# Patient Record
Sex: Female | Born: 1990 | Race: White | Hispanic: No | Marital: Single | Smoking: Former smoker
Health system: Southern US, Community
[De-identification: ages and names within clinical notes are randomized; demographics above are authoritative.]

## PROBLEM LIST (undated history)

## (undated) DIAGNOSIS — F419 Anxiety disorder, unspecified: Secondary | ICD-10-CM

## (undated) HISTORY — PX: ANTERIOR CRUCIATE LIGAMENT REPAIR: SHX115

## (undated) NOTE — ED Provider Notes (Signed)
 Formatting of this note is different from the original. eMERGENCY dEPARTMENT eNCOUnter    CHIEF COMPLAINT   Chief Complaint  Patient presents with  ? Allergic Reaction    c/o hives all day without improvement with benadryl   HPI   Debra Avery is a 70 y.o. female who presents with hives all day. She took Benadryl at 3 and 2 Benadryl at 9 PM. She, states, that the hives pop up in different places, and are itchy.she denies any new exposures new foods, new detergents fabric softeners. She is staying at her sister's, but she uses the same detergents. She denies any fevers. She denies any difficulty swallowing, or difficulty breathing  PAST MEDICAL HISTORY   History reviewed. No pertinent past medical history.  SURGICAL HISTORY   Past Surgical History  Procedure Date  ? Knee arthroscopy w/ acl reconstruction and hamstring graft     HARDWARE REMOVAL DONE  2 WKS AGO   CURRENT MEDICATIONS   Celebrex Concerta monocycline, endocet, zoloft, phenergan   ALLERGIES   No Known Allergies  FAMILY HISTORY   History reviewed. No pertinent family history.  SOCIAL HISTORY   History   Social History  ? Marital Status: N/A    Spouse Name: N/A    Number of Children: N/A  ? Years of Education: N/A   Social History Main Topics  ? Smoking status: Never Smoker   ? Smokeless tobacco: None  ? Alcohol Use: No  ? Drug Use: No  ? Sexually Active:    Other Topics Concern  ? None   Social History Narrative  ? None   REVIEW OF SYSTEMS   Constitutional:  Denies fever, chills, weight loss or weakness.  Respiratory:  Denies cough or shortness of breath.  Cardiovascular:  Denies chest pain, palpitations or swelling.  GI:  Denies abdominal pain, nausea, vomiting, or diarrhea.   Skin:  Intact, warm and dry,  with rash Neurologic:  Denies headache, focal weakness or sensory changes.   Lymphatic:  Denies swollen glands.   See HPI for further details.  PHYSICAL EXAM   VITAL SIGNS ED Triage  Vitals  Temp 07/05/11 2351 98 F (36.7 C)  Temp Source 07/05/11 2351 Oral  Heart Rate 07/05/11 2351 109   Heart Rate Source --   Resp 07/05/11 2351 18   BP 07/05/11 2351 123/93 mmHg  BP Location --   BP Method 07/05/11 2351 Automatic  SpO2 07/05/11 2351 96 %  O2 Device 07/05/11 2351 None (Room air)  O2 Flow Rate (L/min) --   Pain Assessment 07/05/11 2351 0-10  Pain Score 07/05/11 2351 Zero   Constitutional:  Well developed, well nourished, no acute distress, non-toxic appearance.   HENT:  Normocephalic, atraumatic, bilateral external ears normal, oropharynx moist, no oral exudates, nose normal.  Neck:  Normal range of motion, no tenderness, supple, no stridor.  Respiratory:  Normal breath sounds, no respiratory distress, no wheezing, no chest tenderness.  Cardiovascular:  Normal heart rate, normal rhythm, no murmurs, no rubs, no gallops.  GI:  Bowel sounds normal, soft, no tenderness, no masses, no pulsatile masses.  Extremities:  Intact distal pulses, no edema, no tenderness, no cyanosis, no clubbing. Good range of motion in all major joints. No tenderness to palpation or major deformities noted.  Back: No midline or CVA tenderness.  Skin: Areas of erythema, which appear hive-like on her back, trunk, legs, and arms  FINAL IMPRESSION   Rash Urticaria  Montie LITTIE Fogo, MD 07/06/11 0120 Electronically signed by Montie  LITTIE Fogo, MD at 07/06/2011  1:20 AM EDT

---

## 2018-03-02 ENCOUNTER — Emergency Department
Admission: EM | Admit: 2018-03-02 | Discharge: 2018-03-02 | Disposition: A | Discharge status: Home / Self Care | Payer: Self-pay | Attending: Emergency Medicine | Admitting: Emergency Medicine

## 2018-03-02 ENCOUNTER — Emergency Department: Payer: Self-pay

## 2018-03-02 ENCOUNTER — Encounter: Payer: Self-pay | Admitting: Emergency Medicine

## 2018-03-02 DIAGNOSIS — R002 Palpitations: Secondary | ICD-10-CM | POA: Insufficient documentation

## 2018-03-02 DIAGNOSIS — R42 Dizziness and giddiness: Secondary | ICD-10-CM | POA: Insufficient documentation

## 2018-03-02 DIAGNOSIS — R0602 Shortness of breath: Secondary | ICD-10-CM | POA: Insufficient documentation

## 2018-03-02 DIAGNOSIS — Z87891 Personal history of nicotine dependence: Secondary | ICD-10-CM | POA: Insufficient documentation

## 2018-03-02 DIAGNOSIS — F419 Anxiety disorder, unspecified: Secondary | ICD-10-CM | POA: Insufficient documentation

## 2018-03-02 LAB — FIBRIN DERIVATIVES D-DIMER (ARMC ONLY): FIBRIN DERIVATIVES D-DIMER (ARMC): 176.14 ng{FEU}/mL (ref 0.00–499.00)

## 2018-03-02 LAB — BASIC METABOLIC PANEL
ANION GAP: 11 (ref 5–15)
BUN: 15 mg/dL (ref 6–20)
CALCIUM: 9.6 mg/dL (ref 8.9–10.3)
CO2: 24 mmol/L (ref 22–32)
Chloride: 103 mmol/L (ref 101–111)
Creatinine, Ser: 0.74 mg/dL (ref 0.44–1.00)
Glucose, Bld: 105 mg/dL — ABNORMAL HIGH (ref 65–99)
POTASSIUM: 3.5 mmol/L (ref 3.5–5.1)
Sodium: 138 mmol/L (ref 135–145)

## 2018-03-02 LAB — CBC
HEMATOCRIT: 40.9 % (ref 35.0–47.0)
Hemoglobin: 14.3 g/dL (ref 12.0–16.0)
MCH: 30.3 pg (ref 26.0–34.0)
MCHC: 35.1 g/dL (ref 32.0–36.0)
MCV: 86.5 fL (ref 80.0–100.0)
Platelets: 353 10*3/uL (ref 150–440)
RBC: 4.72 MIL/uL (ref 3.80–5.20)
RDW: 13.8 % (ref 11.5–14.5)
WBC: 11.4 10*3/uL — ABNORMAL HIGH (ref 3.6–11.0)

## 2018-03-02 LAB — POCT PREGNANCY, URINE: PREG TEST UR: NEGATIVE

## 2018-03-02 LAB — TROPONIN I

## 2018-03-02 NOTE — ED Triage Notes (Signed)
Patient presents to ED via EMS from home with c/o shortness of breath. Patient was recently on a long plane ride from [place]. Patient reports while she was on the plane she had concerns that she was going to die. Patient states "I have been having increased anxiety".
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
## 2018-03-02 NOTE — ED Notes (Signed)
Patient transported to X-ray 

## 2018-03-06 DIAGNOSIS — F419 Anxiety disorder, unspecified: Secondary | ICD-10-CM

## 2018-03-06 HISTORY — DX: Anxiety disorder, unspecified: F41.9

## 2018-06-22 ENCOUNTER — Other Ambulatory Visit: Payer: Self-pay

## 2018-06-22 ENCOUNTER — Emergency Department: Payer: Self-pay

## 2018-06-22 ENCOUNTER — Emergency Department
Admission: EM | Admit: 2018-06-22 | Discharge: 2018-06-22 | Disposition: A | Discharge status: Home / Self Care | Payer: Self-pay | Attending: Emergency Medicine | Admitting: Emergency Medicine

## 2018-06-22 DIAGNOSIS — N939 Abnormal uterine and vaginal bleeding, unspecified: Secondary | ICD-10-CM

## 2018-06-22 DIAGNOSIS — Z87891 Personal history of nicotine dependence: Secondary | ICD-10-CM | POA: Insufficient documentation

## 2018-06-22 DIAGNOSIS — N920 Excessive and frequent menstruation with regular cycle: Secondary | ICD-10-CM | POA: Insufficient documentation

## 2018-06-22 HISTORY — DX: Anxiety disorder, unspecified: F41.9

## 2018-06-22 LAB — URINALYSIS, COMPLETE (UACMP) WITH MICROSCOPIC
Bacteria, UA: NONE SEEN
Bilirubin Urine: NEGATIVE
Glucose, UA: NEGATIVE mg/dL
Ketones, ur: NEGATIVE mg/dL
Leukocytes, UA: NEGATIVE
Nitrite: NEGATIVE
PH: 5 (ref 5.0–8.0)
Protein, ur: 30 mg/dL — AB
RBC / HPF: >50 RBC/hpf — ABNORMAL HIGH (ref 0–5)
SPECIFIC GRAVITY, URINE: 1.026 (ref 1.005–1.030)

## 2018-06-22 LAB — BASIC METABOLIC PANEL
Anion gap: 5 (ref 5–15)
BUN: 14 mg/dL (ref 6–20)
CALCIUM: 9.1 mg/dL (ref 8.9–10.3)
CO2: 26 mmol/L (ref 22–32)
CREATININE: 0.71 mg/dL (ref 0.44–1.00)
Chloride: 107 mmol/L (ref 98–111)
GFR calc Af Amer: >60 mL/min (ref >60)
GFR calc non Af Amer: >60 mL/min (ref >60)
GLUCOSE: 106 mg/dL — AB (ref 70–99)
Potassium: 3.8 mmol/L (ref 3.5–5.1)
Sodium: 138 mmol/L (ref 135–145)

## 2018-06-22 LAB — CBC
HCT: 33.6 % — ABNORMAL LOW (ref 35.0–47.0)
Hemoglobin: 11.8 g/dL — ABNORMAL LOW (ref 12.0–16.0)
MCH: 30.8 pg (ref 26.0–34.0)
MCHC: 35.3 g/dL (ref 32.0–36.0)
MCV: 87.3 fL (ref 80.0–100.0)
Platelets: 408 10*3/uL (ref 150–440)
RBC: 3.85 MIL/uL (ref 3.80–5.20)
RDW: 14.3 % (ref 11.5–14.5)
WBC: 12.2 10*3/uL — ABNORMAL HIGH (ref 3.6–11.0)

## 2018-06-22 LAB — TYPE AND SCREEN
ABO/RH(D): O POS
Antibody Screen: NEGATIVE

## 2018-06-22 LAB — TSH: TSH: 2.44 u[IU]/mL (ref 0.350–4.500)

## 2018-06-22 LAB — POCT PREGNANCY, URINE: PREG TEST UR: NEGATIVE

## 2018-06-22 LAB — HCG, QUANTITATIVE, PREGNANCY: hCG, Beta Chain, Quant, S: <1 m[IU]/mL (ref <5)

## 2018-06-22 MED ORDER — ONDANSETRON 4 MG PO TBDP: 4.0000 mg | ORAL_TABLET | Freq: Four times a day (QID) | ORAL | 0 refills | Status: DC | PRN

## 2018-06-22 MED ORDER — NORETHINDRONE-ETH ESTRADIOL 1-35 MG-MCG PO TABS: ORAL_TABLET | ORAL | 0 refills | Status: DC

## 2018-06-22 MED ORDER — NORETHINDRONE-ETH ESTRADIOL 1-35 MG-MCG PO TABS: 1.0000 | ORAL_TABLET | Freq: Every day | ORAL | 0 refills | Status: DC

## 2018-06-22 MED ORDER — NORETHINDRONE ACETATE 5 MG PO TABS
5.0000 mg | ORAL_TABLET | Freq: Once | ORAL | Status: AC
  Administered 2018-06-22: 5 mg via ORAL
  Filled 2018-06-22: qty 1

## 2018-06-22 MED ORDER — IBUPROFEN 600 MG PO TABS
600.0000 mg | ORAL_TABLET | ORAL | Status: AC
  Administered 2018-06-22: 600 mg via ORAL
  Filled 2018-06-22: qty 1

## 2018-06-22 MED ORDER — ONDANSETRON 4 MG PO TBDP: 4.0000 mg | ORAL_TABLET | Freq: Four times a day (QID) | ORAL | 0 refills | Status: AC | PRN

## 2018-06-22 MED ORDER — NORETHINDRONE-ETH ESTRADIOL 1-35 MG-MCG PO TABS: ORAL_TABLET | ORAL | 0 refills | Status: AC

## 2018-06-22 NOTE — Discharge Instructions (Signed)
Please follow up closely with obstetrics and gynecology or your primary doctor.  Return to the emergency room if your bleeding worsens, you become weak and dizzy or lightheaded, you have an episode of passing out, develop severe bleeding such as more than 1 soaked pad per hour for more than 3 straight hours, develop abdominal or pelvic pain, fevers chills or other new concerns arise.   

## 2018-06-22 NOTE — ED Provider Notes (Signed)
[facility] Emergency Department Provider Note   ____________________________________________   First MD Initiated Contact with Patient 06/22/18 1652     (approximate)  I have reviewed the triage vital signs and the nursing notes.   HISTORY  Chief Complaint Vaginal Bleeding    HPI Debra Avery is a 27 y.o. female's been having heavier than expected vaginal bleeding since about Wednesday.  Has fairly regular menstrual cycles, expected her menses to start last week and began Wednesday.  However on Friday she was going through about a tampon every 20 minutes for a few hours.  Additionally, for the last couple of days she has had some slight crampiness that seems to be improving, and her bleeding has been lessening over the last day but continues to bleed with fairly large clots at times.  She reports she is suspicious she could be developing polycystic ovaries based on discussion with friends and others.  Denies history of cysts.  Not in any pain at present, it was somewhat painful not felt similar to normal menstrual cycle last couple of days.  Bleeding is slowly decreasing.  Takes no medications except an antidepressant is a non-smoker without history of blood clots  Denies pregnancy  No chest pain or trouble breathing.  Past Medical History:  Diagnosis Date  . Anxiety 03/2018    There are no active problems to display for this patient.   Past Surgical History:  Procedure Laterality Date  . ANTERIOR CRUCIATE LIGAMENT REPAIR      Prior to Admission medications   Medication Sig Start Date End Date Taking? Authorizing Provider  norethindrone-ethinyl estradiol 1/35 (ORTHO-NOVUM 1/35, 28,) tablet Take 1 tablet 4 times daily for 4 days (until bleeding stops), then Take 1 tablet 3 times daily for 3 days, then Take 1 tablet 2 times daily for 2 days, then Take 1 tablet daily for 3 weeks 06/22/18   Sharyn Creamer, MD  ondansetron (ZOFRAN ODT) 4 MG  disintegrating tablet Take 1 tablet (4 mg total) by mouth every 6 (six) hours as needed for nausea or vomiting. 06/22/18   Sharyn Creamer, MD    Allergies Patient has no known allergies.  History reviewed. No pertinent family history.  Social History Social History   Tobacco Use  . Smoking status: Former Smoker    Last attempt to quit: 10/02/2017    Years since quitting: 0.7  . Smokeless tobacco: Never Used  Substance Use Topics  . Alcohol use: Yes    Comment: Social  . Drug use: Yes    Types: Marijuana    Review of Systems Constitutional: No fever/chills but some lightheadedness last 2 days ENT: No sore throat. Cardiovascular: Denies chest pain. Respiratory: Denies shortness of breath. Gastrointestinal: No abdominal pain.  No nausea, no vomiting.  No diarrhea.  No constipation. Genitourinary: Negative for dysuria. GYN: See HPI Musculoskeletal: Negative for back pain. Skin: Negative for rash. Neurological: Negative for headaches or focal weakness.    ____________________________________________   PHYSICAL EXAM:  VITAL SIGNS: ED Triage Vitals  Enc Vitals Group     BP 06/22/18 1517 131/73     Pulse Rate 06/22/18 1517 95     Resp --      Temp 06/22/18 1517 98.4 F (36.9 C)     Temp Source 06/22/18 1517 Oral     SpO2 06/22/18 1517 99 %     Weight 06/22/18 1518 230 lb (104.3 kg)     Height 06/22/18 1518 5\' 2"  (1.575 m)  Head Circumference --      Peak Flow --      Pain Score 06/22/18 1517 0     Pain Loc --      Pain Edu? --      Excl. in GC? --     Constitutional: Alert and oriented. Well appearing and in no acute distress. Eyes: Conjunctivae are normal. Head: Atraumatic. Nose: No congestion/rhinnorhea. Mouth/Throat: Mucous membranes are moist. Neck: No stridor.   Cardiovascular: Normal rate, regular rhythm. Grossly normal heart sounds.  Good peripheral circulation. Respiratory: Normal respiratory effort.  No retractions. Lungs CTAB. Gastrointestinal:  Soft and nontender. No distention. Musculoskeletal: No lower extremity tenderness nor edema. Neurologic:  Normal speech and language. No gross focal neurologic deficits are appreciated.  Skin:  Skin is warm, dry and intact. No rash noted. Psychiatric: Mood and affect are normal. Speech and behavior are normal.  ____________________________________________   LABS (all labs ordered are listed, but only abnormal results are displayed)  Labs Reviewed  BASIC METABOLIC PANEL - Abnormal; Notable for the following components:      Result Value   Glucose, Bld 106 (*)    All other components within normal limits  CBC - Abnormal; Notable for the following components:   WBC 12.2 (*)    Hemoglobin 11.8 (*)    HCT 33.6 (*)    All other components within normal limits  URINALYSIS, COMPLETE (UACMP) WITH MICROSCOPIC - Abnormal; Notable for the following components:   Color, Urine YELLOW (*)    APPearance CLOUDY (*)    Hgb urine dipstick LARGE (*)    Protein, ur 30 (*)    RBC / HPF >50 (*)    All other components within normal limits  HCG, QUANTITATIVE, PREGNANCY  TSH  POC URINE PREG, ED  POCT PREGNANCY, URINE  CBG MONITORING, ED  TYPE AND SCREEN   ____________________________________________  EKG   ____________________________________________  RADIOLOGY    Ultrasound no acute findings reviewed by me. ____________________________________________   PROCEDURES  Procedure(s) performed: None  Procedures  Critical Care performed: No  ____________________________________________   INITIAL IMPRESSION / ASSESSMENT AND PLAN / ED COURSE  Pertinent labs & imaging results that were available during my care of the patient were reviewed by me and considered in my medical decision making (see chart for details).  Patient presents for vaginal bleeding.  Appears consistent in timing with her normal cycle, but heavier than normal.  Differential diagnosis includes menorrhagia, polycystic  ovarian syndrome, hypothyroidism, hormonal change, ectopic.  No signs or symptoms of infectious etiology.  Hemoglobin reduced but mildly so, few days of bleeding in her she reports her breathing is improved.  Normal vital signs.  Nontoxic well-appearing.  Will obtain ultrasound to further evaluate.  Pregnancy test negative  ----------------------------------------- 7:30 PM on 06/22/2018 -----------------------------------------  Case and care discussed with Dr. Feliberto GottronSchermerhorn who advises tapered birth control, wrote prescription as advised by Dr. Feliberto GottronSchermerhorn and patient will follow-up as appointment with OB[facility].  Return precautions and treatment recommendations and follow-up discussed with the patient who is agreeable with the plan.  Carefully expressed discussed return precautions especially around ongoing or worsening bleeding with both the patient and her mother who are in agreement.       ____________________________________________   FINAL CLINICAL IMPRESSION(S) / ED DIAGNOSES  Final diagnoses:  Episode of heavy vaginal bleeding  Menorrhagia with regular cycle      NEW MEDICATIONS STARTED DURING THIS VISIT:  Current Discharge Medication List  START taking these medications   Details  norethindrone-ethinyl estradiol 1/35 (ORTHO-NOVUM 1/35, 28,) tablet Take 1 tablet 4 times daily for 4 days (until bleeding stops), then Take 1 tablet 3 times daily for 3 days, then Take 1 tablet 2 times daily for 2 days, then Take 1 tablet daily for 3 weeks Qty: 1 Package, Refills: 0    ondansetron (ZOFRAN ODT) 4 MG disintegrating tablet Take 1 tablet (4 mg total) by mouth every 6 (six) hours as needed for nausea or vomiting. Qty: 20 tablet, Refills: 0         Note:  This document was prepared using Dragon voice recognition software and may include unintentional dictation errors.     Sharyn Creamer, MD 06/22/18 [phone]

## 2018-06-22 NOTE — ED Notes (Signed)
Pt to and from US. ABCs intact. NAD.

## 2018-06-22 NOTE — ED Triage Notes (Signed)
Pt arrives via POV with complaints of vaginal bleeding. Pt ambulatory to triage. Pt states  She started bleeding sept 11th, started passing clots the 13. Pt states she placed tampon and soaked through in 20 min, then changed tampon 3 times after that she estimates some clots to be 3-4" in size. Continues to pass clots and had to start wearing adult briefs today. Pt states  "it feels like scrapped my insides out". Pt complains of weakness, lightheaded, headache, "seeing stars a little bit". Norm periods have been mild, lasting 4-5 days.

## 2018-06-22 NOTE — ED Notes (Signed)
Quale MD at bedside 

## 2019-09-07 ENCOUNTER — Other Ambulatory Visit: Payer: Self-pay

## 2019-09-07 DIAGNOSIS — Z20822 Contact with and (suspected) exposure to covid-19: Secondary | ICD-10-CM

## 2019-09-09 ENCOUNTER — Telehealth: Payer: Self-pay

## 2019-09-09 NOTE — Telephone Encounter (Signed)
Patient advise that result are not back in yet

## 2019-09-10 ENCOUNTER — Telehealth: Payer: Self-pay

## 2019-09-10 LAB — NOVEL CORONAVIRUS, NAA: SARS-CoV-2, NAA: NOT DETECTED

## 2019-09-10 NOTE — Telephone Encounter (Signed)
Pt called for covid results. Advised pt that results are not back.

## 2020-06-03 IMAGING — US US PELVIS COMPLETE
1 series · 13 of 25 positions shown · non-contrast
Comparison: None.

CLINICAL DATA: Pelvic pain and vaginal bleeding.

EXAM:
TRANSABDOMINAL AND TRANSVAGINAL ULTRASOUND OF PELVIS
DOPPLER ULTRASOUND OF OVARIES
TECHNIQUE: Both transabdominal and transvaginal ultrasound examinations of the
pelvis were performed. Transabdominal technique was performed for
global imaging of the pelvis including uterus, ovaries, adnexal
regions, and pelvic cul-de-sac.
It was necessary to proceed with endovaginal exam following the
transabdominal exam to visualize the adnexa. Color and duplex
Doppler ultrasound was utilized to evaluate blood flow to the
ovaries.

[Series 1: us pelvis complete · 0.22mm/px · 13 of 116 slices shown]
[im 1/116]
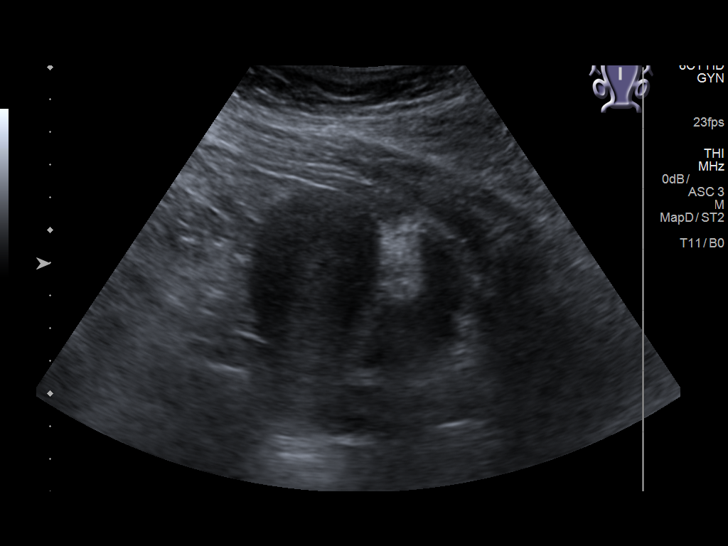
[im 10/116]
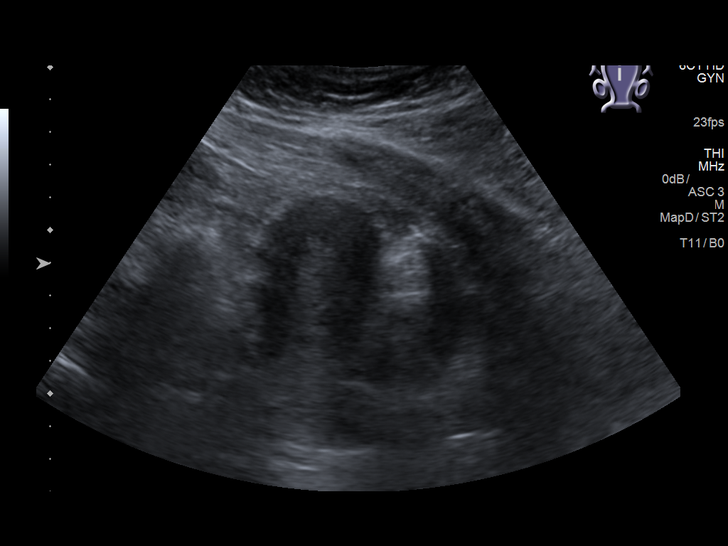
[im 20/116]
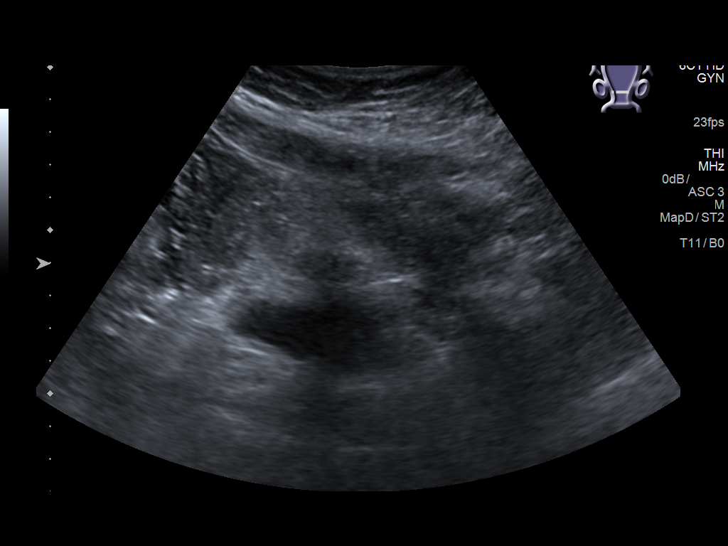
[im 29/116]
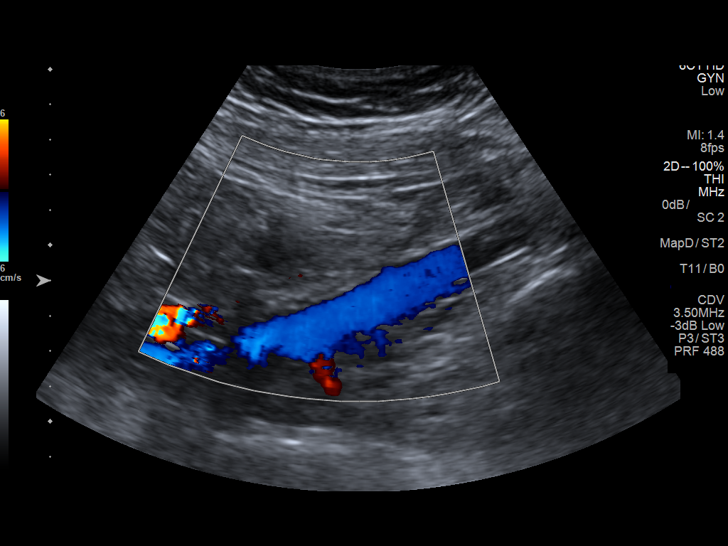
[im 39/116]
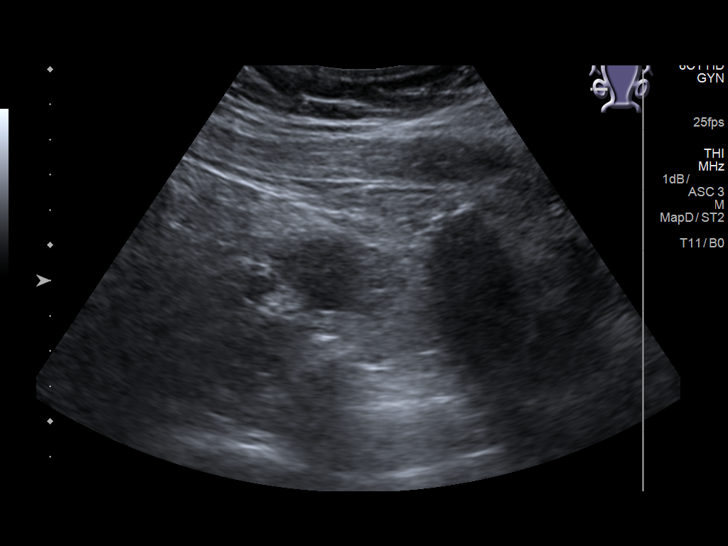
[im 48/116]
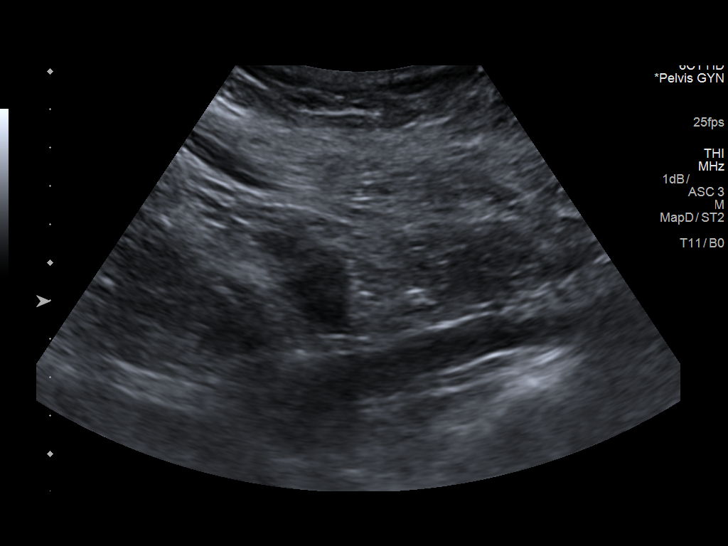
[im 58/116]
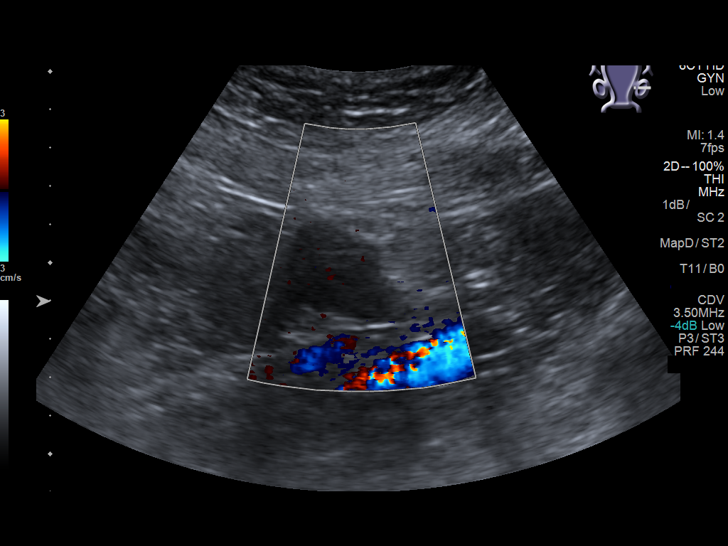
[im 68/116]
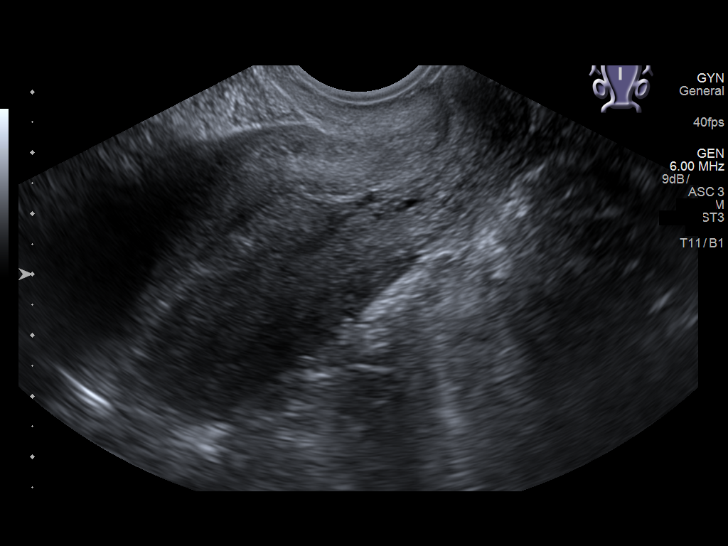
[im 77/116]
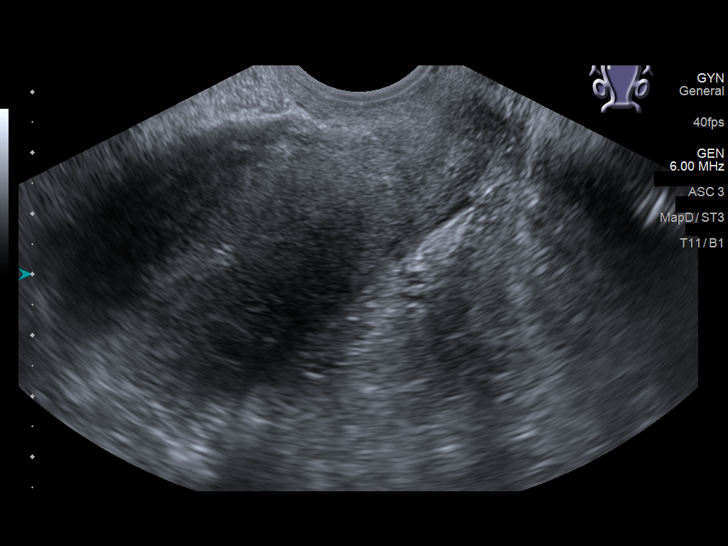
[im 87/116]
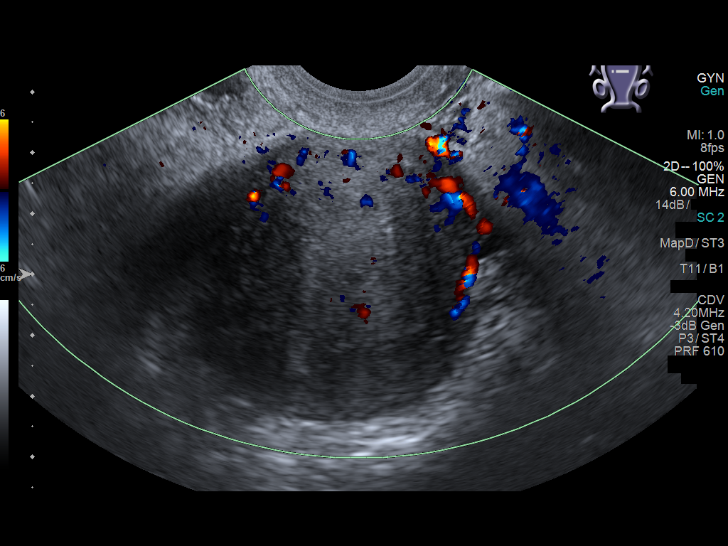
[im 96/116]
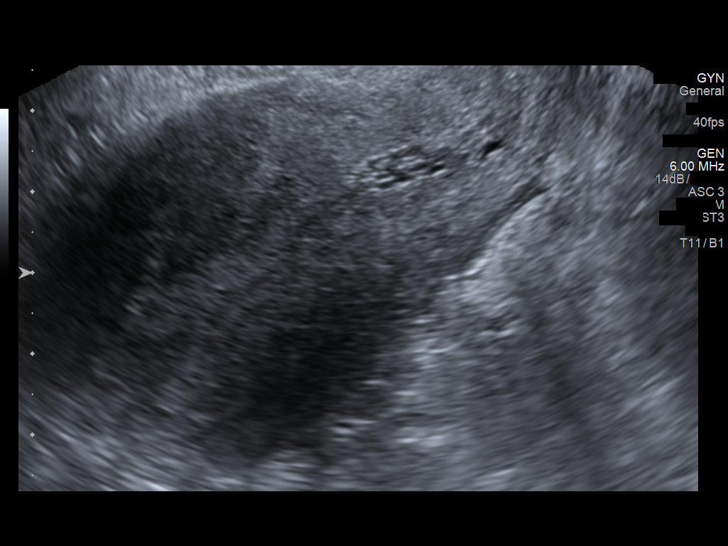
[im 106/116]
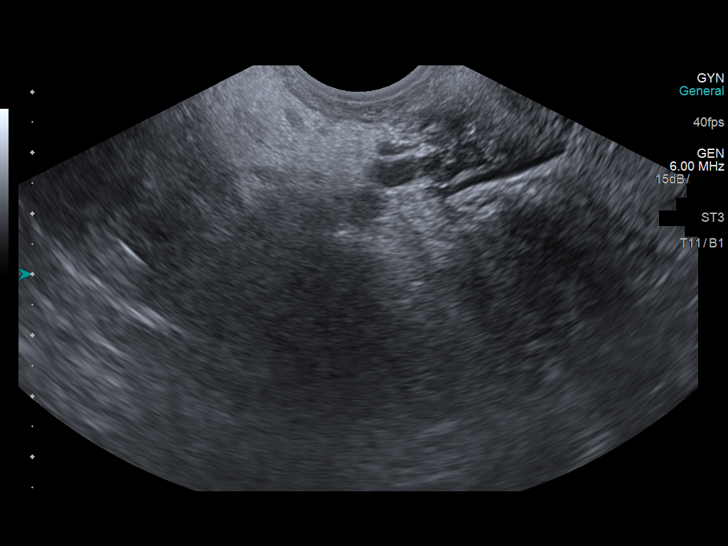
[im 116/116]
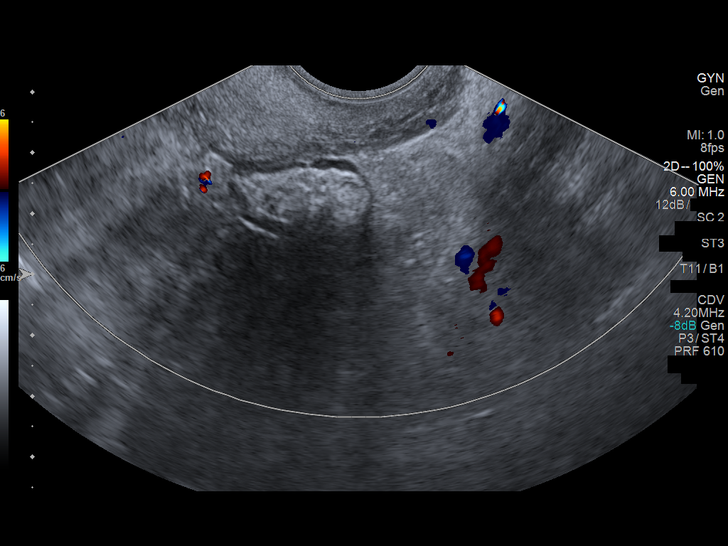

[13 of 25 positions shown; findings below may reference images not displayed]

FINDINGS: Uterus

Measurements: 8.3 x 4.0 x 4.9 cm. No fibroids or other mass
visualized.

Endometrium

Thickness: 8 mm.  No focal abnormality visualized.

Right ovary

Measurements: 3.0 x 2.0 x 1.9 cm. Normal appearance/no adnexal mass.

Left ovary

Measurements: 3.1 x 2.4 x 2.1 cm. Normal appearance/no adnexal mass.

Pulsed Doppler evaluation of both ovaries demonstrates normal
low-resistance arterial and venous waveforms.

Other findings

No abnormal free fluid.
IMPRESSION: Negative. No pelvic mass or other significant abnormality
identified.

No sonographic evidence for ovarian torsion.

## 2022-07-13 ENCOUNTER — Ambulatory Visit
Admission: EM | Admit: 2022-07-13 | Discharge: 2022-07-13 | Disposition: A | Discharge status: Home / Self Care | Payer: No Typology Code available for payment source | Source: Ambulatory Visit | Attending: Emergency Medicine | Admitting: Emergency Medicine

## 2022-07-13 ENCOUNTER — Emergency Department
Admission: EM | Admit: 2022-07-13 | Discharge: 2022-07-13 | Disposition: A | Discharge status: Home / Self Care | Payer: Self-pay | Attending: Emergency Medicine | Admitting: Emergency Medicine

## 2022-07-13 ENCOUNTER — Other Ambulatory Visit: Payer: Self-pay

## 2022-07-13 DIAGNOSIS — T7421XA Adult sexual abuse, confirmed, initial encounter: Secondary | ICD-10-CM | POA: Insufficient documentation

## 2022-07-13 DIAGNOSIS — Z0441 Encounter for examination and observation following alleged adult rape: Secondary | ICD-10-CM | POA: Diagnosis present

## 2022-07-13 LAB — POC URINE PREG, ED: Preg Test, Ur: NEGATIVE — NL

## 2022-07-13 MED ORDER — PROMETHAZINE HCL 25 MG PO TABS
25.0000 mg | ORAL_TABLET | Freq: Once | ORAL | Status: AC
  Administered 2022-07-13: 25 mg via ORAL
  Filled 2022-07-13: qty 1

## 2022-07-13 MED ORDER — METRONIDAZOLE 500 MG PO TABS
2000.0000 mg | ORAL_TABLET | Freq: Once | ORAL | Status: AC
  Administered 2022-07-13: 2000 mg via ORAL
  Filled 2022-07-13: qty 4

## 2022-07-13 MED ORDER — LIDOCAINE HCL (PF) 1 % IJ SOLN
1.0000 mL | Freq: Once | INTRAMUSCULAR | Status: AC
  Administered 2022-07-13: 1.2 mL
  Filled 2022-07-13: qty 5

## 2022-07-13 MED ORDER — ULIPRISTAL ACETATE 30 MG PO TABS
30.0000 mg | ORAL_TABLET | Freq: Once | ORAL | Status: AC
  Administered 2022-07-13: 30 mg via ORAL
  Filled 2022-07-13: qty 1

## 2022-07-13 MED ORDER — CEFTRIAXONE SODIUM 1 G IJ SOLR
500.0000 mg | Freq: Once | INTRAMUSCULAR | Status: AC
  Administered 2022-07-13: 1000 mg via INTRAMUSCULAR
  Filled 2022-07-13: qty 10

## 2022-07-13 MED ORDER — AZITHROMYCIN 500 MG PO TABS
1000.0000 mg | ORAL_TABLET | Freq: Once | ORAL | Status: AC
  Administered 2022-07-13: 1000 mg via ORAL
  Filled 2022-07-13: qty 2
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
## 2022-07-13 NOTE — Discharge Instructions (Addendum)
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 rid of medications that are no longer needed or have expired: Take the medication to a medication take-back program. Check with your pharmacy or law enforcement to find a location. If you cannot return the medication, ask your care team how to get rid of this medication safely. NOTE: This sheet is a summary. It may not cover all possible information. If you have questions about this medicine, talk to your doctor, pharmacist, or health care provider.  2022 Elsevier/Gold Standard (2020-10-30 09:56:16)         '[x]'$    Zithromax (Azithromycin) Tablets     What is this medication? AZITHROMYCIN (az ith roe MYE sin) treats infections caused by bacteria. It belongs to a group of medications called antibiotics. It will not treat colds, the flu, or infections caused by viruses. This medicine may be used for other purposes; ask your health care provider or pharmacist  if you have questions. COMMON BRAND NAME(S): Zithromax, Zithromax Tri-Pak, Zithromax Z-Pak What should I tell my care team before I take this medication? They need to know if you have any of these conditions: History of blood diseases, like leukemia History of irregular heartbeat Kidney disease Liver disease Myasthenia gravis An unusual or allergic reaction to azithromycin, erythromycin, other macrolide antibiotics, foods, dyes, or preservatives Pregnant or trying to get pregnant Breast-feeding How should I use this medication? Take this medication by mouth with a full glass of water. Follow the directions on the prescription label. The tablets can be taken with food or on an empty stomach. If the medication upsets your stomach, take it with food. Take your medication at regular intervals. Do not take your medication more often than directed. Take all of your medication as directed even if you think you are better. Do not skip doses or stop your medication early. Talk to your care team regarding the use of this medication in children. While this medication may be prescribed for children as young as 6 months for selected conditions, precautions do apply. Overdosage: If you think you have taken too much of this medicine contact a poison control center or emergency room at once. NOTE: This medicine is only for you. Do not share this medicine with others. What if I miss a dose? If you miss a dose, take it as soon as you can. If it is almost time for your next dose, take only that dose. Do not take double or extra doses. What may interact with this medication? Do not take this medication with any of the following: Cisapride Dronedarone Pimozide Thioridazine This medication may also interact with the following: Antacids that contain aluminum or magnesium Birth control pills Colchicine Cyclosporine Digoxin Ergot alkaloids like dihydroergotamine, ergotamine Nelfinavir Other medications that  prolong the QT interval (an abnormal heart rhythm) Phenytoin Warfarin This list may not describe all possible interactions. Give your health care provider a list of all the medicines, herbs, non-prescription drugs, or dietary supplements you  use. Also tell them if you smoke, drink alcohol, or use illegal drugs. Some items may interact with your medicine. What should I watch for while using this medication? Tell your care team if your symptoms do not start to get better or if they get worse. This medication may cause serious skin reactions. They can happen weeks to months after starting the medication. Contact your care team right away if you notice fevers or flu-like symptoms with a rash. The rash may be red or purple and then turn into blisters or peeling of the skin. Or, you might notice a red rash with swelling of the face, lips or lymph nodes in your neck or under your arms. Do not treat diarrhea with over the counter products. Contact your care team if you have diarrhea that lasts more than 2 days or if it is severe and watery. This medication can make you more sensitive to the sun. Keep out of the sun. If you cannot avoid being in the sun, wear protective clothing and use sunscreen. Do not use sun lamps or tanning beds/booths. What side effects may I notice from receiving this medication? Side effects that you should report to your care team as soon as possible: Allergic reactions or angioedema-skin rash, itching, hives, swelling of the face, eyes, lips, tongue, arms, or legs, trouble swallowing or breathing Heart rhythm changes-fast or irregular heartbeat, dizziness, feeling faint or lightheaded, chest pain, trouble breathing Liver injury-right upper belly pain, loss of appetite, nausea, light-colored stool, dark yellow or brown urine, yellowing skin or eyes, unusual weakness or fatigue Rash, fever, and swollen lymph nodes Redness, blistering, peeling, or loosening of the skin, including inside  the mouth Severe diarrhea, fever Unusual vaginal discharge, itching, or odor Side effects that usually do not require medical attention (report to your care team if they continue or are bothersome): Diarrhea Nausea Stomach pain Vomiting This list may not describe all possible side effects. Call your doctor for medical advice about side effects. You may report side effects to FDA at 1-800-FDA-1088. Where should I keep my medication? Keep out of the reach of children and pets. Store at room temperature between 15 and 30 degrees C (59 and 86 degrees F). Throw away any unused medication after the expiration date. NOTE: This sheet is a summary. It may not cover all possible information. If you have questions about this medicine, talk to your doctor, pharmacist, or health care provider.  2022 Elsevier/Gold Standard (2020-08-15 11:19:31)         '[x]'$    Flagyl (Metronidazole) Capsules or Tablets               TAKE ALL 4 PILLS AT THE SAME TIME.  DO NOT TAKE THIS MEDICATION FOR 3 DAYS (72 HOURS) AFTER DRINKING ALCOHOL,  AND DO NOT DRINK ANY ALCOHOL FOR 3 DAYS (72 HOURS) AFTER YOU TAKE THIS MEDICATION !   What is this medication? METRONIDAZOLE (me troe NI da zole) treats infections caused by bacteria or parasites. It belongs to a group of medications called antibiotics. It will not treat colds, the flu, or infections caused by viruses. This medicine may be used for other purposes; ask your health care provider or pharmacist if you have questions. COMMON BRAND NAME(S): Flagyl What should I tell my care team before I take this medication? They need to know if you have any of these conditions: Cockayne syndrome History of blood diseases such as sickle cell anemia, anemia, or leukemia If you often drink alcohol  Irregular heartbeat or rhythm Kidney disease Liver disease Yeast or fungal infection An unusual or allergic reaction to metronidazole, nitroimidazoles, or other medications, foods, dyes, or  preservatives Pregnant or trying to get pregnant Breast-feeding How should I use this medication? Take this medication by mouth with water. Take it as directed on the prescription label at the same time every day. Take all of this medication unless your care team tells you to stop it early. Keep taking it even if you think you are better. Talk to your care team about the use of this medication in children. While it may be prescribed for children for selected conditions, precautions do apply. Overdosage: If you think you have taken too much of this medicine contact a poison control center or emergency room at once. NOTE: This medicine is only for you. Do not share this medicine with others. What if I miss a dose? If you miss a dose, take it as soon as you can. If it is almost time for your next dose, take only that dose. Do not take double or extra doses. What may interact with this medication? Do not take this medication with any of the following: Alcohol or any product that contains alcohol Cisapride Disulfiram Dronedarone Pimozide Thioridazine This medication may also interact with the following: Birth control pills Busulfan Carbamazepine Certain medications that treat or prevent blood clots like warfarin Cimetidine Lithium Other medications that prolong the QT interval (cause an abnormal heart rhythm) Phenobarbital Phenytoin This list may not describe all possible interactions. Give your health care provider a list of all the medicines, herbs, non-prescription drugs, or dietary supplements you use. Also tell them if you smoke, drink alcohol, or use illegal drugs. Some items may interact with your medicine. What should I watch for while using this medication? Tell your care team if your symptoms do not start to get better or if they get worse. Some products may contain alcohol. Ask your care team if this medication contains alcohol. Be sure to tell all care teams you are taking this  medication. Certain medications, such as metronidazole and disulfiram, can cause an unpleasant reaction when taken with alcohol. The reaction includes flushing, headache, nausea, vomiting, sweating, and increased thirst. The reaction can last from 30 minutes to several hours. If you are being treated for a sexually transmitted disease (STD), avoid sexual contact until you have finished your treatment. Your sexual partner may also need treatment. Birth control may not work properly while you are taking this medication. Talk to your care team about using an extra method of birth control. What side effects may I notice from receiving this medication? Side effects that you should report to your care team as soon as possible: Allergic reactions-skin rash, itching, hives, swelling of the face, lips, tongue, or throat Dizziness, loss of balance or coordination, confusion or trouble speaking Fever, neck pain or stiffness, sensitivity to light, headache, nausea, vomiting, confusion Heart rhythm changes-fast or irregular heartbeat, dizziness, feeling faint or lightheaded, chest pain, trouble breathing Liver injury-right upper belly pain, loss of appetite, nausea, light-colored stool, dark yellow or brown urine, yellowing skin or eyes, unusual weakness or fatigue Pain, tingling, or numbness in the hands or feet Redness, blistering, peeling, or loosening of the skin, including inside the mouth Seizures Severe diarrhea, fever Sudden eye pain or change in vision such as blurry vision, seeing halos around lights, vision loss Unusual vaginal discharge, itching, or odor Side effects that usually do not require medical attention (report to  your care team if they continue or are bothersome): Diarrhea Metallic taste in mouth Nausea Stomach pain This list may not describe all possible side effects. Call your doctor for medical advice about side effects. You may report side effects to FDA at 1-800-FDA-1088. Where  should I keep my medication? Keep out of the reach of children and pets. Store between 15 and 25 degrees C (59 and 77 degrees F). Protect from light. Get rid of any unused medication after the expiration date. To get rid of medications that are no longer needed or have expired: Take the medication to a medication take-back program. Check with your pharmacy or law enforcement to find a location. If you cannot return the medication, check the label or package insert to see if the medication should be thrown out in the garbage or flushed down the toilet. If you are not sure, ask your care team. If it is safe to put it in the trash, take the medication out of the container. Mix the medication with cat litter, dirt, coffee grounds, or other unwanted substance. Seal the mixture in a bag or container. Put it in the trash. NOTE: This sheet is a summary. It may not cover all possible information. If you have questions about this medicine, talk to your doctor, pharmacist, or health care provider.  2022 Elsevier/Gold Standard (2020-11-15 13:29:17)         '[x]'$    Phenergan (Promethazine) tablets  PROMETHAZINE (proe-METH-a-zeen)  COMMON BRAND NAME(S):  Phenergan, Promacot, Promethazine Hydrochloride.  There may be other brand names for this medicine. Sexual Assault Specific:  This medication has been given to you to assist with nausea or possible sleeplessness.  You have been given three $RemoveBef'25mg'FQVytidqnF$  tablets to take AS NEEDED.  You may take  -1 tablet every 6-8 hours as needed. USES:  This medication is an antihistamine.  It can be used to treat allergic reactions and to treat or prevent nausea and vomiting from illness or motion sickness.  It is also used to make you sleep before surgery, and to help treat pain or nausea after surgery.   HOW TO USE:  Your doctor or healthcare provider will tell you how much of this medicine to use and how often.  Take this medicine by mouth with a glass of water or with food or milk.   Take your doses at regular intervals.  Do not take this medication more often than directed. SIDE EFFECTS:  You should report the following side effects to your doctor or healthcare provider as soon as possible:  blurred vision, irregular heartbeat, palpitations, or chest pain or tightness, muscle or facial twitches, pain or difficulty passing urine, dark-colored urine, pale stools, seizures, skin rash (including itching or hives), slowed or shallow breathing, trouble breathing, unusual bleeding or bruising, yellowing of the eyes or skin, swelling in your face or hands, swelling or tingling in your mouth or throat, fever, sweating, confusion, pain in your upper stomach, problems with balance, walking, or speech, seeing or hearing things that are not really there (especially in children). Side effects that usually do not require medical attention but you should report to your doctor or healthcare provider if they continue or are bothersome include:  headache, nightmares, agitation, nervousness, excitability, not being able to sleep (more likely in children), stuffy  or runny nose, dry mouth, mild skin rash or itching, ringing in your ears.  Tell your doctor or healthcare provider if your symptoms do not start to get better in 1-2  days.  You may get drowsy or dizzy.  Do not drive, use machinery, or do anything that needs mental alertness until you know how this medication will affect you.  To reduce the risk of dizzy or fainting spells, do not stand or sit up too quickly, especially if you are an older patient.  Alcohol may increase dizziness and drowsiness. Your mouth can get dry.  Chewing sugarless gum or sucking on hard candy and drinking plenty of water may help.  Contact your doctor if the problem does not go away or is severe.  Since this medication can cause dry eyes and blurred vision, if you wear contact lenses you may feel some discomfort.  Lubricating drops may help.  See your eye doctor if the problem  does not go away or is severe.  This medication can also make you sensitive to the sun.  Keep out of the sun.  If you cannot avoid being in the sun, wear protective clothing and use sunscreen.  Do not use sunlamps or tanning beds/booths.  If you are diabetic, check your blood sugar levels regularly. This list may not describe all possible side effects.  If you notice other effects not listed above, contact your doctor.  You may report side effects to the Food & Drug Administration (FDA) at 1-800-FDA-1088. PRECAUTIONS:  Your doctor or healthcare provider needs to know if you have any of the following conditions:  glaucoma, high blood pressure or heart disease, kidney disease, liver disease, lung disease or breathing problems (like asthma), prostate trouble, pain or difficulty passing urine, seizures, an unusual or allergic reaction to promethazine or phenothiazine medicines (e.g. perphenazine, thioridazine, Compazine, Thorazine, and Trilafon), other medicines, foods, dyes, or preservatives, if you are pregnant or trying to get pregnant, or breast-feeding.  Talk to your pediatrician regarding the use of this medicine in children.  Special care may be needed.  This medicine should not be given to infants and children younger than 19 years old.   Make sure your doctor or healthcare professional knows if you are pregnant or breast-feeding.  Tell your doctor if you have Chronic Obstructive Pulmonary Disease (COPD), asthma, sleep apnea, if you have or have ever had Neuroleptic Malignant Syndrome (NMS),  DRUG INTERACTIONS:  Do not take this medication with any of the following medications:  medicines called MAO Inhibitors (e.g. Nardil, Parnate, Marplan, Eldepryl), or other phenothiazines like trimethobenzamide.  This medication may also interact with the following medications:  barbiturates like phenobarbital, bromocriptine, certain antidepressants, certain antihistamines used in allergy or cold medicines, epinephrine,  levodopa, medications for sleep, medications for mental problems & psychotic disturbances (e.g. amitriptyline, doxepin, nortriptyline, phenylzine, selegiline, Elavil, Pamelor, Sinequan), medications for movement abnormalities such as Parkinson's Disease, medications for gastrointestinal problems, muscle relaxants, narcotic pain medications, sedatives.  Do not drink alcohol while using this medicine. This document does not contain all possible interactions.  Therefore, before using this product, tell your doctor or healthcare provider of all the products you use.  Keep a list of all your medications with you, and share the list with your doctor or healthcare provider. NOTES:  Do not share this medication with others.  If you think you have taken too much of this medicine, contact a poison control center or emergency room at once. MISSED DOSE:  If you miss a dose, take it as soon as you can.  If it is almost time for your next dose, take only that dose.  Do not take double or extra doses.  STORAGE:  Store at room temperature between 68-77 degrees F (20-25 degrees C), away from light and moisture.  Do not store in the bathroom.  Keep all medicines away from children and pets.  Do not flush medications down the toilet or pour them into the drain unless instructed to do so.  Properly discard this product when it is expired or no longer needed.  Consult your pharmacist or local waste disposal company for more details about how to safely discard this product.          '[]'$    Tdap (Tetanus, Diptheria, Pertussis) Vaccine   1. Why get vaccinated? Tdap vaccine can prevent tetanus, diphtheria, and pertussis. Diphtheria and pertussis spread from person to person. Tetanus enters the body through cuts or wounds. TETANUS (T) causes painful stiffening of the muscles. Tetanus can lead to serious health problems, including being unable to open the mouth, having trouble swallowing and breathing, or death. DIPHTHERIA (D) can  lead to difficulty breathing, heart failure, paralysis, or death. PERTUSSIS (aP), also known as "whooping cough," can cause uncontrollable, violent coughing that makes it hard to breathe, eat, or drink. Pertussis can be extremely serious especially in babies and young children, causing pneumonia, convulsions, brain damage, or death. In teens and adults, it can cause weight loss, loss of bladder control, passing out, and rib fractures from severe coughing. 2. Tdap vaccine Tdap is only for children 7 years and older, adolescents, and adults.  Adolescents should receive a single dose of Tdap, preferably at age 27 or 71 years. Pregnant people should get a dose of Tdap during every pregnancy, preferably during the early part of the third trimester, to help protect the newborn from pertussis. Infants are most at risk for severe, life-threatening complications from pertussis. Adults who have never received Tdap should get a dose of Tdap. Also, adults should receive a booster dose of either Tdap or Td (a different vaccine that protects against tetanus and diphtheria but not pertussis) every 10 years, or after 5 years in the case of a severe or dirty wound or burn. Tdap may be given at the same time as other vaccines. 3. Talk with your health care provider Tell your vaccine provider if the person getting the vaccine: Has had an allergic reaction after a previous dose of any vaccine that protects against tetanus, diphtheria, or pertussis, or has any severe, life-threatening allergies Has had a coma, decreased level of consciousness, or prolonged seizures within 7 days after a previous dose of any pertussis vaccine (DTP, DTaP, or Tdap) Has seizures or another nervous system problem Has ever had Guillain-Barr Syndrome (also called "GBS") Has had severe pain or swelling after a previous dose of any vaccine that protects against tetanus or diphtheria In some cases, your health care provider may decide to postpone  Tdap vaccination until a future visit. People with minor illnesses, such as a cold, may be vaccinated. People who are moderately or severely ill should usually wait until they recover before getting Tdap vaccine.  Your health care provider can give you more information. 4. Risks of a vaccine reaction Pain, redness, or swelling where the shot was given, mild fever, headache, feeling tired, and nausea, vomiting, diarrhea, or stomachache sometimes happen after Tdap vaccination. People sometimes faint after medical procedures, including vaccination. Tell your provider if you feel dizzy or have vision changes or ringing in the ears.  As with any medicine, there is a very remote chance of a vaccine causing a severe allergic reaction,  other serious injury, or death. 5. What if there is a serious problem? An allergic reaction could occur after the vaccinated person leaves the clinic. If you see signs of a severe allergic reaction (hives, swelling of the face and throat, difficulty breathing, a fast heartbeat, dizziness, or weakness), call 9-1-1 and get the person to the nearest hospital. For other signs that concern you, call your health care provider.  Adverse reactions should be reported to the Vaccine Adverse Event Reporting System (VAERS). Your health care provider will usually file this report, or you can do it yourself. Visit the VAERS website at www.vaers.SamedayNews.es or call (731)402-0271. VAERS is only for reporting reactions, and VAERS staff members do not give medical advice. 6. The National Vaccine Injury Compensation Program The [employer] Vaccine Injury Compensation Program (VICP) is a federal program that was created to compensate people who may have been injured by certain vaccines. Claims regarding alleged injury or death due to vaccination have a time limit for filing, which may be as short as two years. Visit the VICP website at GoldCloset.com.ee or call 325-380-8154 to learn about  the program and about filing a claim. 7. How can I learn more? Ask your health care provider. Call your local or state health department. Visit the website of the Food and Drug Administration (FDA) for vaccine package inserts and additional information at TraderRating.uy. Contact the Centers for Disease Control and Prevention (CDC): Call 510-766-6867 (1-800-CDC-INFO) or Visit CDC's website at http://hunter.com/. Vaccine Information Statement Tdap (Tetanus, Diphtheria, Pertussis) Vaccine (05/11/2020) This information is not intended to replace advice given to you by your health care provider. Make sure you discuss any questions you have with your health care provider. Document Revised: 06/06/2020 Document Reviewed: 06/06/2020 Elsevier Patient Education  [place]         '[]'$    Genvoya (Elvitegravir; Cobicistat; Emtricitabine; Tenofovir Alafenamide) Tablets   What is this medication? ELVITEGRAVIR; COBICISTAT; EMTRICITABINE; TENOFOVIR ALAFENAMIDE (el vye TEG ra veer; koe BIS i stat; em tri SIT uh bean; te NOE fo veer) is 3 antiretroviral medicines and a medication booster in 1 tablet. It is used to treat HIV. This medicine is not a cure for HIV. This medicine can lower, but not fully prevent, the risk of spreading HIV to others. This medicine may be used for other purposes; ask your health care provider or pharmacist if you have questions. COMMON BRAND NAME(S): Genvoya What should I tell my care team before I take this medication? They need to know if you have any of these conditions: kidney disease liver disease an unusual or allergic reaction to elvitegravir, cobicistat, emtricitabine, tenofovir, other medicines, foods, dyes, or preservatives pregnant or trying to get pregnant breast-feeding How should I use this medication? Take this medicine by mouth with a glass of water. Follow the directions on the prescription label. Take this medicine with  food. Take your medicine at regular intervals. Do not take your medicine more often than directed. For your anti-HIV therapy to work as well as possible, take each dose exactly as prescribed. Do not skip doses or stop your medicine even if you feel better. Skipping doses may make the HIV virus resistant to this medicine and other medicines. Do not stop taking except on your doctor's advice. Talk to your pediatrician regarding the use of this medicine in children. While this drug may be prescribed for selected conditions, precautions do apply. Overdosage: If you think you have taken too much of this medicine contact a poison control  center or emergency room at once. NOTE: This medicine is only for you. Do not share this medicine with others. What if I miss a dose? If you miss a dose, take it as soon as you can. If it is almost time for your next dose, take only that dose. Do not take double or extra doses. What may interact with this medication? Do not take this medicine with any of the following medications: adefovir alfuzosin certain medicines for seizures like carbamazepine, phenobarbital, phenytoin cisapride irinotecan lumacaftor; ivacaftor lurasidone medicines for cholesterol like lovastatin, simvastatin medicines for headaches like dihydroergotamine, ergotamine, methylergonovine midazolam naloxegol other antiviral medicines for HIV or AIDS pimozide rifampin sildenafil [facility]. John's wort triazolam This medicine may also interact with the following medications: antacids atorvastatin bosentan buprenorphine; naloxone certain antibiotics like clarithromycin, telithromycin, rifabutin, rifapentine certain medications for anxiety or sleep like buspirone, clorazepate, diazepam, estazolam, flurazepam, zolpidem certain medicines for blood pressure or heart disease like amlodipine, diltiazem, felodipine, metoprolol, nicardipine, nifedipine, timolol, verapamil certain medicines for depression,  anxiety, or psychiatric disturbances certain medicines for erectile dysfunction like avanafil, sildenafil, tadalafil, vardenafil certain medicines for fungal infection like itraconazole, ketoconazole, voriconazole certain medicines that treat or prevent blood clots like warfarin, apixaban, betrixaban, dabigatran, edoxaban, and rivaroxaban colchicine cyclosporine female hormones, like estrogens and progestins and birth control pills medicines for infection like acyclovir, cidofovir, valacyclovir, ganciclovir, valganciclovir medicines for irregular heart beat like amiodarone, bepridil, digoxin, disopyramide, dofetilide, flecainide, lidocaine, mexiletine, propafenone, quinidine metformin oxcarbazepine phenothiazines like perphenazine, risperidone, thioridazine salmeterol sirolimus steroid medicines like betamethasone, budesonide, ciclesonide, dexamethasone, fluticasone, methylprednisolone, mometasone, triamcinolone tacrolimus This list may not describe all possible interactions. Give your health care provider a list of all the medicines, herbs, non-prescription drugs, or dietary supplements you use. Also tell them if you smoke, drink alcohol, or use illegal drugs. Some items may interact with your medicine. What should I watch for while using this medication? Visit your doctor or health care professional for regular check ups. Discuss any new symptoms with your doctor. You will need to have important blood work done while on this medicine. HIV is spread to others through sexual or blood contact. Talk to your doctor about how to stop the spread of HIV. If you have hepatitis B, talk to your doctor if you plan to stop this medicine. The symptoms of hepatitis B may get worse if you stop this medicine. Birth control pills may not work properly while you are taking this medicine. Talk to your doctor about using an extra method of birth control. Women who can still have children must use a reliable form  of barrier contraception, like a condom. What side effects may I notice from receiving this medication? Side effects that you should report to your doctor or health care professional as soon as possible: allergic reactions like skin rash, itching or hives, swelling of the face, lips, or tongue breathing problems fast, irregular heartbeat muscle pain or weakness signs and symptoms of kidney injury like trouble passing urine or change in the amount of urine signs and symptoms of liver injury like dark yellow or brown urine; general ill feeling or flu-like symptoms; light-colored stools; loss of appetite; right upper belly pain; unusually weak or tired; yellowing of the eyes or skin Side effects that usually do not require medical attention (report to your doctor or health care professional if they continue or are bothersome): diarrhea headache nausea tiredness This list may not describe all possible side effects. Call your doctor for medical advice about  side effects. You may report side effects to FDA at 1-800-FDA-1088. Where should I keep my medication? Keep out of the reach of children. Store at room temperature below 30 degrees C (86 degrees F). Throw away any unused medicine after the expiration date. NOTE: This sheet is a summary. It may not cover all possible information. If you have questions about this medicine, talk to your doctor, pharmacist, or health care provider.  2022 Elsevier/Gold Standard (2019-08-23 17:54:51)
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
## 2024-07-15 ENCOUNTER — Ambulatory Visit
Admission: EM | Admit: 2024-07-15 | Discharge: 2024-07-15 | Disposition: A | Discharge status: Home / Self Care | Payer: Self-pay

## 2024-07-15 ENCOUNTER — Encounter: Payer: Self-pay | Admitting: Emergency Medicine

## 2024-07-15 DIAGNOSIS — R21 Rash and other nonspecific skin eruption: Secondary | ICD-10-CM

## 2024-07-15 MED ORDER — HYDROXYZINE HCL 25 MG PO TABS: 25.0000 mg | ORAL_TABLET | Freq: Four times a day (QID) | ORAL | 0 refills | Status: AC | PRN

## 2024-07-15 MED ORDER — TRIAMCINOLONE ACETONIDE 0.5 % EX OINT: 1.0000 | TOPICAL_OINTMENT | Freq: Two times a day (BID) | CUTANEOUS | 0 refills | Status: AC

## 2024-07-15 NOTE — ED Triage Notes (Signed)
 Pt presents c/o rash on chest x 4 days. Pt reports it started on her chest then spread to both arm and is now starting to spread down the side of her left breast. Pt denies contact with any known allergens.

## 2024-07-15 NOTE — ED Provider Notes (Signed)
 [facility]    CSN: [id number] Arrival date & time: 07/15/24  1056      History   Chief Complaint Chief Complaint  Patient presents with   Rash    HPI Debra Avery is a 33 y.o. female presenting for 3 to 4-day history of pruritic erythematous rash of chest and arms.  Patient cannot identify any triggers.  Denies any new medications, foods, body washes or detergents.  Has tried topical Benadryl cream without relief.  She denies any associated throat swelling or breathing difficulty.  HPI  Past Medical History:  Diagnosis Date   Anxiety 03/2018    There are no active problems to display for this patient.   Past Surgical History:  Procedure Laterality Date   ANTERIOR CRUCIATE LIGAMENT REPAIR      OB History   No obstetric history on file.      Home Medications    Prior to Admission medications   Medication Sig Start Date End Date Taking? Authorizing Provider  hydrOXYzine (ATARAX) 25 MG tablet Take 1 tablet (25 mg total) by mouth every 6 (six) hours as needed for itching. 07/15/24  Yes Arvis Jolan NOVAK, PA-C  methylphenidate 36 MG PO CR tablet Take 36 mg by mouth daily. 07/18/10  Yes [provider]  propranolol (INDERAL) 20 MG tablet Take 20 mg by mouth. 10/14/21  Yes [provider]  sertraline (ZOLOFT) 100 MG tablet Take 150 mg by mouth. 10/14/21  Yes [provider]  sertraline (ZOLOFT) 50 MG tablet  02/27/12  Yes [provider]  triamcinolone ointment (KENALOG) 0.5 % Apply 1 Application topically 2 (two) times daily. 07/15/24  Yes Arvis Jolan B, PA-C  norethindrone -ethinyl estradiol  1/35 (ORTHO-NOVUM 1/35, 28,) tablet Take 1 tablet 4 times daily for 4 days (until bleeding stops), then Take 1 tablet 3 times daily for 3 days, then Take 1 tablet 2 times daily for 2 days, then Take 1 tablet daily for 3 weeks 06/22/18   Dicky Anes, MD  ondansetron  (ZOFRAN  ODT) 4 MG disintegrating tablet Take 1 tablet (4 mg total) by  mouth every 6 (six) hours as needed for nausea or vomiting. 06/22/18   Dicky Anes, MD    Family History Family History  Problem Relation Age of Onset   Diabetes Mellitus I Mother     Social History Social History   Tobacco Use   Smoking status: Former    Current packs/day: 0.00    Types: Cigarettes    Quit date: 10/02/2017    Years since quitting: 6.7    Passive exposure: Past   Smokeless tobacco: Never  Vaping Use   Vaping status: Never Used  Substance Use Topics   Alcohol use: Yes    Comment: Social   Drug use: Yes    Types: Marijuana     Allergies   Patient has no known allergies.   Review of Systems Review of Systems  Constitutional:  Negative for fatigue.  HENT:  Negative for sore throat and trouble swallowing.   Respiratory:  Negative for chest tightness, shortness of breath and wheezing.   Skin:  Positive for rash.  Neurological:  Negative for weakness.     Physical Exam Triage Vital Signs ED Triage Vitals  Encounter Vitals Group     BP 07/15/24 1117 (!) 153/101     Girls Systolic BP Percentile --      Girls Diastolic BP Percentile --      Boys Systolic BP Percentile --  Boys Diastolic BP Percentile --      Pulse Rate 07/15/24 1117 95     Resp 07/15/24 1117 18     Temp 07/15/24 1117 98.8 F (37.1 C)     Temp Source 07/15/24 1117 Oral     SpO2 07/15/24 1117 97 %     Weight 07/15/24 1116 229 lb 15 oz (104.3 kg)     Height --      Head Circumference --      Peak Flow --      Pain Score 07/15/24 1115 0     Pain Loc --      Pain Education --      Exclude from Growth Chart --    No data found.  Updated Vital Signs BP (!) 153/101 (BP Location: Right Arm)   Pulse 95   Temp 98.8 F (37.1 C) (Oral)   Resp 18   Wt 229 lb 15 oz (104.3 kg)   LMP 06/25/2024 (Exact Date)   SpO2 97%   BMI 42.06 kg/m    Physical Exam Vitals and nursing note reviewed.  Constitutional:      General: She is not in acute distress.    Appearance: Normal  appearance. She is not ill-appearing or toxic-appearing.  HENT:     Head: Normocephalic and atraumatic.     Nose: Nose normal.     Mouth/Throat:     Mouth: Mucous membranes are moist.     Pharynx: Oropharynx is clear.  Eyes:     General: No scleral icterus.       Right eye: No discharge.        Left eye: No discharge.     Conjunctiva/sclera: Conjunctivae normal.  Cardiovascular:     Rate and Rhythm: Normal rate and regular rhythm.     Heart sounds: Normal heart sounds.  Pulmonary:     Effort: Pulmonary effort is normal. No respiratory distress.     Breath sounds: Normal breath sounds.  Musculoskeletal:     Cervical back: Neck supple.  Skin:    General: Skin is dry.     Findings: Rash present.  Neurological:     General: No focal deficit present.     Mental Status: She is alert. Mental status is at baseline.     Motor: No weakness.     Gait: Gait normal.  Psychiatric:        Mood and Affect: Mood normal.        Behavior: Behavior normal.           UC Treatments / Results  Labs (all labs ordered are listed, but only abnormal results are displayed) Labs Reviewed - No data to display  EKG   Radiology No results found.  Procedures Procedures (including critical care time)  Medications Ordered in UC Medications - No data to display  Initial Impression / Assessment and Plan / UC Course  I have reviewed the triage vital signs and the nursing notes.  Pertinent labs & imaging results that were available during my care of the patient were reviewed by me and considered in my medical decision making (see chart for details).   33 year old female presents for rash on her chest and arms for the past few days.  No identifiable trigger.  See images of rash above.  Prescribed topical triamcinolone ointment and hydroxyzine and also advised cool compresses.  Reviewed return and ER precautions.   Final Clinical Impressions(s) / UC Diagnoses   Final diagnoses:  Rash  and nonspecific skin eruption   Discharge Instructions   None    ED Prescriptions     Medication Sig Dispense Auth. Provider   triamcinolone ointment (KENALOG) 0.5 % Apply 1 Application topically 2 (two) times daily. 45 g Arvis Huxley B, PA-C   hydrOXYzine (ATARAX) 25 MG tablet Take 1 tablet (25 mg total) by mouth every 6 (six) hours as needed for itching. 30 tablet Ashira Kirsten B, PA-C      PDMP not reviewed this encounter.   Arvis Huxley B, PA-C 07/15/24 1143
